# Patient Record
Sex: Male | Born: 1975 | Race: White | Hispanic: No | Marital: Single | Smoking: Current every day smoker
Health system: Southern US, Community
[De-identification: ages and names within clinical notes are randomized; demographics above are authoritative.]

## PROBLEM LIST (undated history)

## (undated) DIAGNOSIS — I10 Essential (primary) hypertension: Secondary | ICD-10-CM

## (undated) HISTORY — PX: APPENDECTOMY: SHX54

## (undated) HISTORY — DX: Essential (primary) hypertension: I10

---

## 2014-05-06 ENCOUNTER — Ambulatory Visit: Payer: Self-pay

## 2014-06-28 ENCOUNTER — Emergency Department (HOSPITAL_COMMUNITY)
Admission: EM | Admit: 2014-06-28 | Discharge: 2014-06-28 | Disposition: A | Discharge status: Home / Self Care | Payer: Self-pay

## 2014-06-28 NOTE — ED Notes (Signed)
Called no answer

## 2016-12-28 ENCOUNTER — Emergency Department (HOSPITAL_BASED_OUTPATIENT_CLINIC_OR_DEPARTMENT_OTHER): Payer: Self-pay

## 2016-12-28 ENCOUNTER — Emergency Department (HOSPITAL_BASED_OUTPATIENT_CLINIC_OR_DEPARTMENT_OTHER)
Admission: EM | Admit: 2016-12-28 | Discharge: 2016-12-28 | Disposition: A | Discharge status: Home / Self Care | Payer: Self-pay | Attending: Emergency Medicine | Admitting: Emergency Medicine

## 2016-12-28 ENCOUNTER — Encounter (HOSPITAL_BASED_OUTPATIENT_CLINIC_OR_DEPARTMENT_OTHER): Payer: Self-pay | Admitting: Emergency Medicine

## 2016-12-28 ENCOUNTER — Other Ambulatory Visit: Payer: Self-pay

## 2016-12-28 DIAGNOSIS — I10 Essential (primary) hypertension: Secondary | ICD-10-CM | POA: Insufficient documentation

## 2016-12-28 LAB — BASIC METABOLIC PANEL
ANION GAP: 8 (ref 5–15)
BUN: 16 mg/dL (ref 6–20)
CO2: 26 mmol/L (ref 22–32)
Calcium: 9.6 mg/dL (ref 8.9–10.3)
Chloride: 102 mmol/L (ref 101–111)
Creatinine, Ser: 0.89 mg/dL (ref 0.61–1.24)
GFR calc Af Amer: >60 mL/min (ref >60)
GFR calc non Af Amer: >60 mL/min (ref >60)
GLUCOSE: 108 mg/dL — AB (ref 65–99)
POTASSIUM: 3.6 mmol/L (ref 3.5–5.1)
Sodium: 136 mmol/L (ref 135–145)

## 2016-12-28 LAB — CBC
HEMATOCRIT: 42.2 % (ref 39.0–52.0)
HEMOGLOBIN: 15.2 g/dL (ref 13.0–17.0)
MCH: 31.7 pg (ref 26.0–34.0)
MCHC: 36 g/dL (ref 30.0–36.0)
MCV: 88.1 fL (ref 78.0–100.0)
Platelets: 211 10*3/uL (ref 150–400)
RBC: 4.79 MIL/uL (ref 4.22–5.81)
RDW: 12 % (ref 11.5–15.5)
WBC: 9.2 10*3/uL (ref 4.0–10.5)

## 2016-12-28 LAB — TROPONIN I: Troponin I: <0.03 ng/mL (ref <0.03)

## 2016-12-28 MED ORDER — HYDROCHLOROTHIAZIDE 12.5 MG PO TABS: 25.0000 mg | ORAL_TABLET | Freq: Every day | ORAL | 0 refills | Status: AC

## 2016-12-28 NOTE — Discharge Instructions (Signed)
Please find primary care provider for further management of your health.  Take medication prescribed for your blood pressure and have it recheck in 1 week.

## 2016-12-28 NOTE — ED Notes (Signed)
Pt c/o CP, R arm numbness, ShOB, diaphoresis. Pt reports that all theses symptoms are intermittent and have been going on greater than 1 yr.

## 2016-12-28 NOTE — ED Provider Notes (Signed)
[facility] EMERGENCY DEPARTMENT Provider Note   CSN: [id number] Arrival date & time: 12/28/16  1621     History   Chief Complaint No chief complaint on file.   HPI Jermaine Mcmillan is a 41 y.o. male.  HPI   41 year old male presenting for evaluation of high blood pressure.  Patient states he took his son to the doctor for an office visit yesterday when the nurse asked him if he is doing okay because he does not look well.  The nurse also checked his blood pressure and was noted to be 170 systolic.  She encourage patient to go to the hospital for further evaluation of his high blood pressure.  Patient mentioned for more than 6 months he has had intermittent chest discomfort.  He describes it as a sharp pain in his left chest, nonradiating, lasting for a few seconds with associated diaphoresis and shortness of breath.  Pain is nonexertional.  No active chest pain at this time.  He also endorsed having recurrent headache.  Describes headache as a tension type, affecting his forehead, lasting for the past several days.  No associated vision changes or focal numbness or weakness.  He does not follow-up with her primary care provider and have never been diagnosed with high blood pressure.  He admits that he is going through a lot of stress with his sister recently passed away from having blood clots.  He denies any cough, hemoptysis, unilateral leg swelling or calf pain.  He is a smoker and smoke close to a pack a day, and occasional drinker.  No history of diabetes.  No past medical history on file.  There are no active problems to display for this patient.   Past Surgical History:  Procedure Laterality Date  . APPENDECTOMY         Home Medications    Prior to Admission medications   Not on File    Family History No family history on file.  Social History Social History   Tobacco Use  . Smoking status: Not on file  Substance Use Topics  . Alcohol use: Not on  file  . Drug use: Not on file     Allergies   Patient has no allergy information on record.   Review of Systems Review of Systems  All other systems reviewed and are negative.    Physical Exam Updated Vital Signs BP (!) 164/115 (BP Location: Left Arm)   Pulse (!) 104   Temp 98.1 F (36.7 C) (Oral)   Resp 20   Ht 6\' 1"  (1.854 m)   Wt 136.1 kg (300 lb)   SpO2 97%   BMI 39.58 kg/m   Physical Exam  Constitutional: He appears well-developed and well-nourished. No distress.  HENT:  Head: Atraumatic.  Eyes: Conjunctivae are normal.  Neck: Neck supple.  Cardiovascular: Intact distal pulses.  Mild tachycardia without murmur rubs gallops  Pulmonary/Chest: Effort normal and breath sounds normal.  Abdominal: Soft. He exhibits no distension. There is no tenderness.  Musculoskeletal: He exhibits no edema.  Neurological: He is alert. He has normal strength. No cranial nerve deficit or sensory deficit. GCS eye subscore is 4. GCS verbal subscore is 5. GCS motor subscore is 6.  Skin: No rash noted.  Psychiatric: He has a normal mood and affect.  Nursing note and vitals reviewed.    ED Treatments / Results  Labs (all labs ordered are listed, but only abnormal results are displayed) Labs Reviewed  BASIC METABOLIC PANEL -  Abnormal; Notable for the following components:      Result Value   Glucose, Bld 108 (*)    All other components within normal limits  CBC  TROPONIN I    EKG  EKG Interpretation  Date/Time:  Friday December 28 2016 16:29:27 EST Ventricular Rate:  101 PR Interval:  152 QRS Duration: 98 QT Interval:  356 QTC Calculation: 461 R Axis:   52 Text Interpretation:  Sinus tachycardia Otherwise normal ECG No STEMI.  Confirmed by Alona BeneLong, Joshua [phone]([id number]) on 12/28/2016 4:35:16 PM       Radiology Dg Chest 2 View  Result Date: 12/28/2016 CLINICAL DATA:  Cough and congestion for 3-4 days. EXAM: CHEST  2 VIEW COMPARISON:  None. FINDINGS: Lungs are clear. Heart size  is normal. No pneumothorax or pleural effusion. No acute bony abnormality. IMPRESSION: No acute disease. Electronically Signed   By: Drusilla Kannerhomas  Dalessio M.D.   On: 12/28/2016 17:25    Procedures Procedures (including critical care time)  Medications Ordered in ED Medications - No data to display   Initial Impression / Assessment and Plan / ED Course  I have reviewed the triage vital signs and the nursing notes.  Pertinent labs & imaging results that were available during my care of the patient were reviewed by me and considered in my medical decision making (see chart for details).     BP (!) 164/115 (BP Location: Left Arm)   Pulse (!) 104   Temp 98.1 F (36.7 C) (Oral)   Resp 20   Ht 6\' 1"  (1.854 m)   Wt 136.1 kg (300 lb)   SpO2 97%   BMI 39.58 kg/m    Final Clinical Impressions(s) / ED Diagnoses   Final diagnoses:  HTN (hypertension)    ED Discharge Orders    None     4:54 PM Patient was recommended to come to the ER because his blood pressure was elevated when it was checked by a nurse yesterday.  He did not have any specific symptoms at that time.  He does voice concern because he is having recurrent chest discomfort for more than 6 months and occasional shortness of breath as well as having headache.  On exam he has no focal neuro deficit.  5:32 PM Mild tachycardia likely 2/2 anxiety.  Doubt PE.  Labs are reassuring, cxr unremarkable, EKG without concerning changes.  Will prescribe HCTZ and recommend outpt f/u with PCP.  Resources provided.  Return precaution given.     Fayrene Helperran, Nykerria Macconnell, PA-C 12/28/16 1735    Maia PlanLong, Joshua G, MD 12/28/16 [phone]

## 2016-12-28 NOTE — ED Triage Notes (Addendum)
Patient reports having BP checked yesterday and it was elevated.  Reports it was checked again today and BP was elevated at 169/113.  Reports he was told to have this checked by doctor.  C/o headache "for a couple of days", reports chest pain for  "a couple of weeks".

## 2018-04-25 IMAGING — CR DG CHEST 2V
2 series · 2 of 2 positions shown · non-contrast
Comparison: None.

CLINICAL DATA: Cough and congestion for 3-4 days.

EXAM:
CHEST  2 VIEW

[w chest pa *]
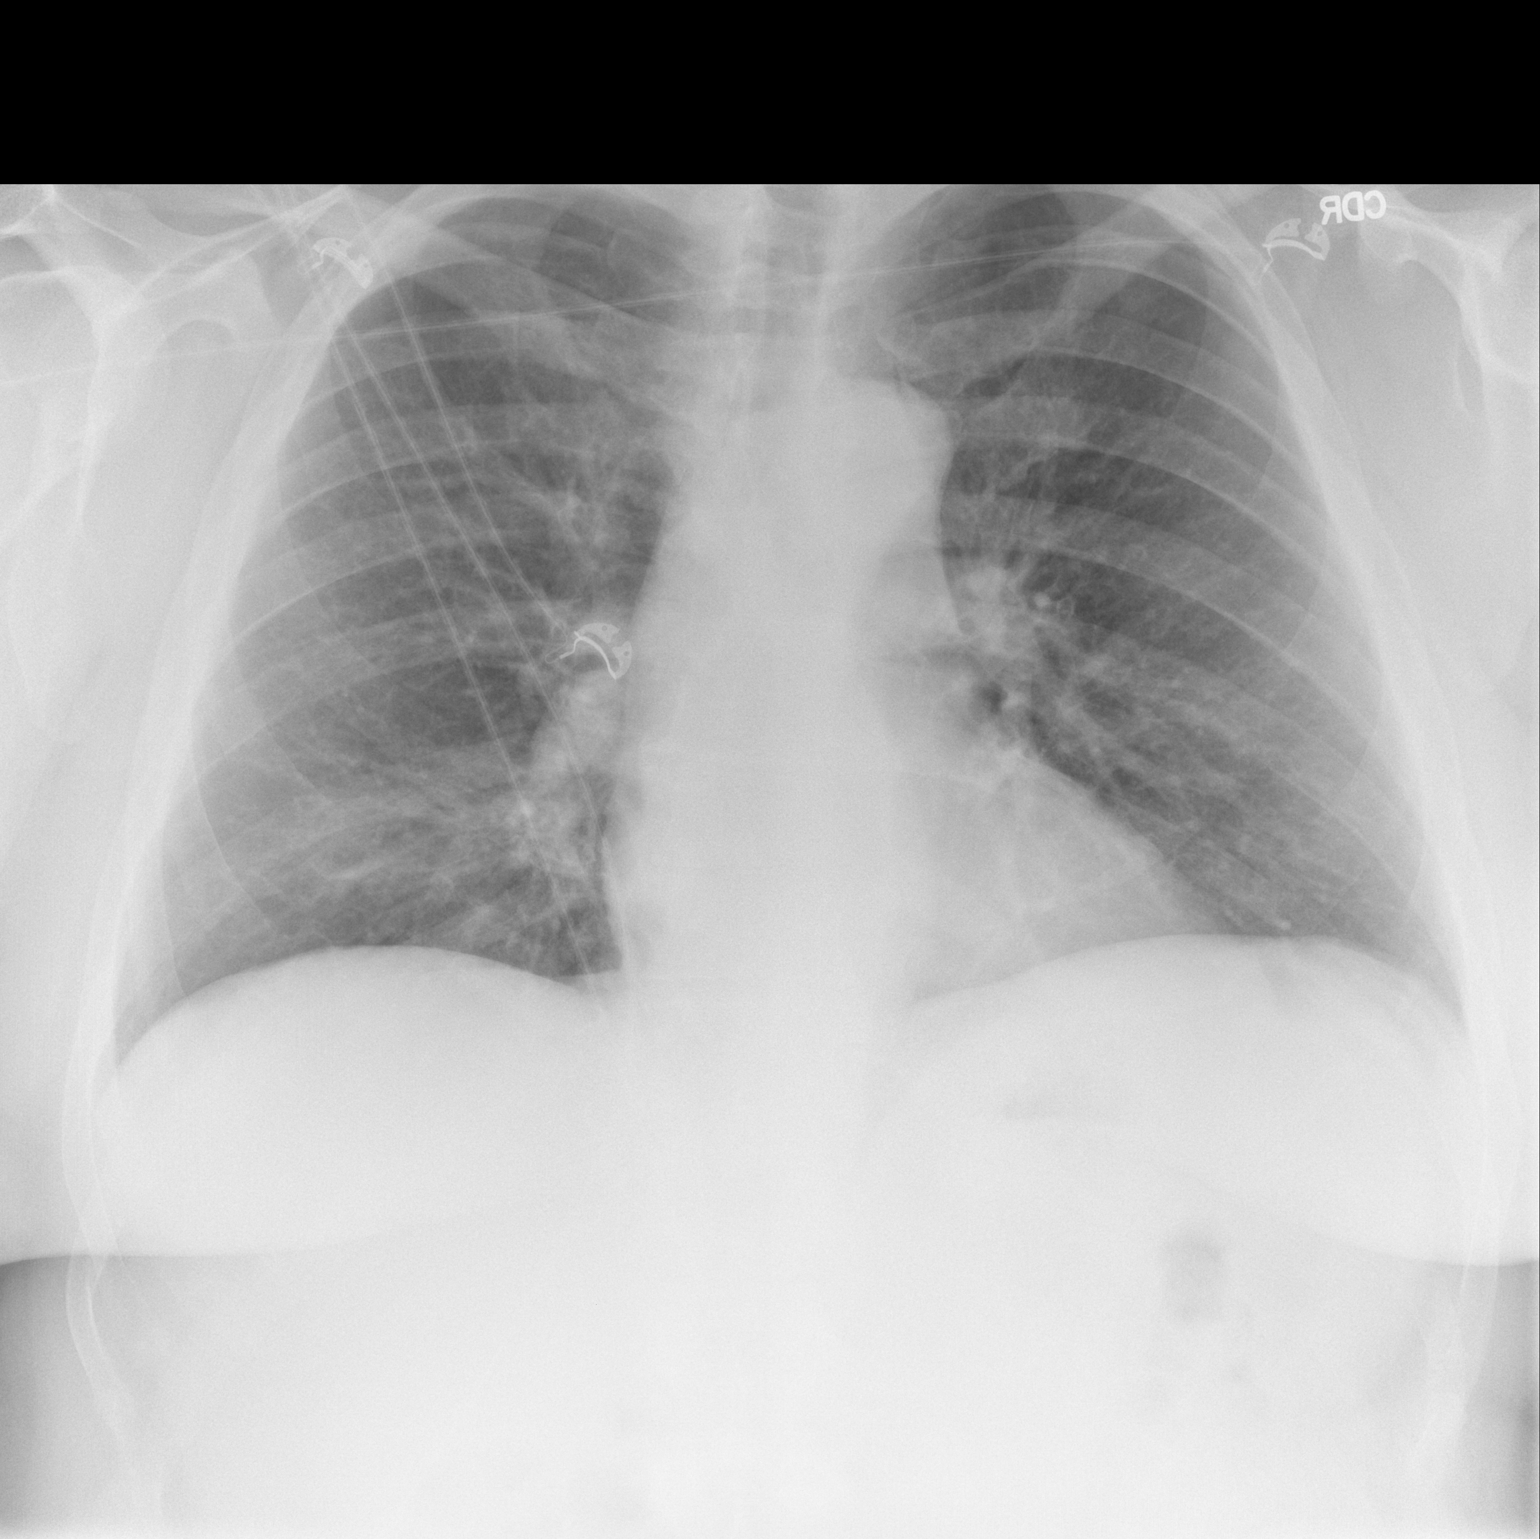

[w chest lat *]
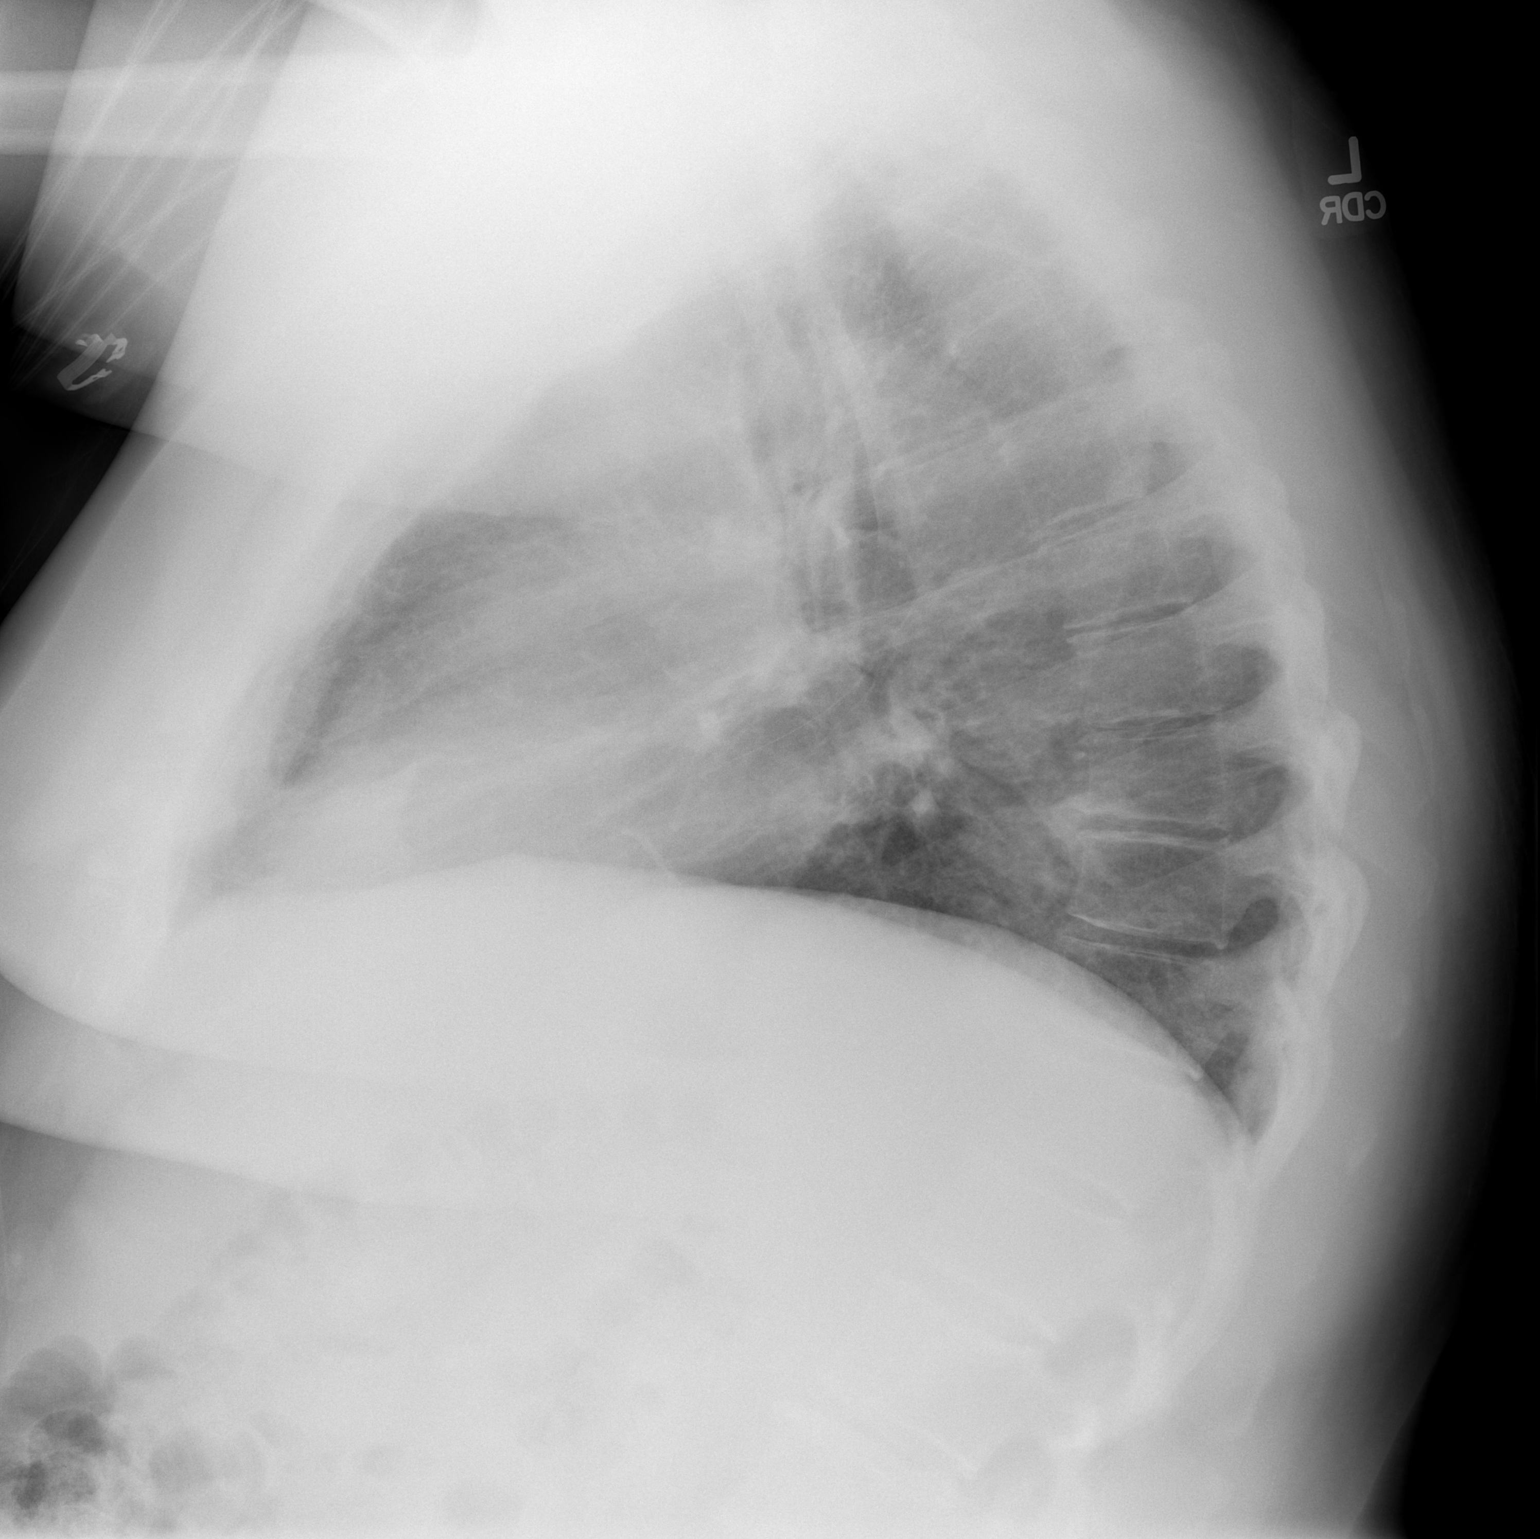

[2 of 2 positions shown; findings below may reference images not displayed]

FINDINGS: Lungs are clear. Heart size is normal. No pneumothorax or pleural
effusion. No acute bony abnormality.
IMPRESSION: No acute disease.

## 2019-12-02 ENCOUNTER — Institutional Professional Consult (permissible substitution): Payer: No Typology Code available for payment source | Admitting: Neurology

## 2019-12-03 ENCOUNTER — Encounter: Payer: Self-pay | Admitting: Neurology

## 2019-12-07 ENCOUNTER — Other Ambulatory Visit: Payer: Self-pay

## 2019-12-07 ENCOUNTER — Encounter: Payer: Self-pay | Admitting: Neurology

## 2019-12-07 ENCOUNTER — Ambulatory Visit (INDEPENDENT_AMBULATORY_CARE_PROVIDER_SITE_OTHER): Payer: No Typology Code available for payment source | Admitting: Neurology

## 2019-12-07 VITALS — BP 154/102 | HR 83 | Ht 74.0 in | Wt 353.0 lb

## 2019-12-07 DIAGNOSIS — R030 Elevated blood-pressure reading, without diagnosis of hypertension: Secondary | ICD-10-CM | POA: Diagnosis not present

## 2019-12-07 DIAGNOSIS — R0683 Snoring: Secondary | ICD-10-CM | POA: Diagnosis not present

## 2019-12-07 DIAGNOSIS — G4719 Other hypersomnia: Secondary | ICD-10-CM | POA: Diagnosis not present

## 2019-12-07 DIAGNOSIS — R351 Nocturia: Secondary | ICD-10-CM

## 2019-12-07 DIAGNOSIS — R0681 Apnea, not elsewhere classified: Secondary | ICD-10-CM

## 2019-12-07 DIAGNOSIS — G2581 Restless legs syndrome: Secondary | ICD-10-CM

## 2019-12-07 NOTE — Progress Notes (Signed)
Subjective:    Patient ID: Jermaine Mcmillan is a 44 y.o. male.  HPI     Jermaine FoleySaima Maxwel Meadowcroft, MD, PhD [facility] [address], [address] [address] [place], [place] [address]   Dear Chrissie NoaWilliam,   I saw your patient, Jermaine Mcmillan, upon your kind request in my sleep clinic today for initial consultation of his sleep disorder, in particular, concern for underlying obstructive sleep apnea.  The patient is unaccompanied today.  As you know, Mr. Jermaine Mcmillan is a 44 year old right-handed gentleman with an underlying medical history of hypertension, restless leg syndrome, prediabetes, vitamin D and severe obesity with a BMI of over 40, who reports Snoring and excessive daytime somnolence.  I reviewed your office note from 10/30/2019.  His Epworth sleepiness score is 18 out of 24, fatigue severity score is 50 out of 63. He reports being told that he has stops in his breathing. He has very occasionally woken up with a sense of gasping for air. He has no known family history of sleep apnea. His restless leg symptoms respond to Mirapex. He goes to bed around 9 and rise time is around 6. He has no trouble falling asleep. He does have significant nocturia about 3+ times per average night. He denies recurrent morning headaches. He has not fallen asleep while driving or at the wheel but has come close and at times has had all over. 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 He lives with his son who is 46 years old, the son shares the same bedroom for now until they moved to a different place. The son has a smaller bed. They have a TV in the bedroom but they don't typically have it on at night. The patient is divorced. He restarted smoking recently but is in the process of quitting. He does not smoke in the house or in the car around his son. He drinks alcohol occasionally, maybe once a week, drinks caffeine in limitation, in the  form of coffee, 1 cup in the morning, typically does not finish the cup. He has gained weight in the past year and his sleepiness and snoring have become worse over the past at least 1 year.  His Past Medical History Is Significant For: Past Medical History:  Diagnosis Date  . Hypertension     His Past Surgical History Is Significant For: Past Surgical History:  Procedure Laterality Date  . APPENDECTOMY    . APPENDECTOMY      His Family History Is Significant For: No family history on file.  His Social History Is Significant For: Social History   Socioeconomic History  . Marital status: Single    Spouse name: Not on file  . Number of children: Not on file  . Years of education: Not on file  . Highest education level: Not on file  Occupational History  . Not on file  Tobacco Use  . Smoking status: Current Every Day Smoker    Packs/day: 1.00    Types: Cigarettes  . Smokeless tobacco: Never Used  Substance and Sexual Activity  . Alcohol use: Yes  . Drug use: No  . Sexual activity: Not on file  Other Topics Concern  . Not on file  Social History Narrative  . Not on file   Social Determinants of Health   Financial Resource Strain:   . Difficulty of Paying Living Expenses: Not on file  Food Insecurity:   .  Worried About Programme researcher, broadcasting/film/video in the Last Year: Not on file  . Ran Out of Food in the Last Year: Not on file  Transportation Needs:   . Lack of Transportation (Medical): Not on file  . Lack of Transportation (Non-Medical): Not on file  Physical Activity:   . Days of Exercise per Week: Not on file  . Minutes of Exercise per Session: Not on file  Stress:   . Feeling of Stress : Not on file  Social Connections:   . Frequency of Communication with Friends and Family: Not on file  . Frequency of Social Gatherings with Friends and Family: Not on file  . Attends Religious Services: Not on file  . Active Member of Clubs or Organizations: Not on file  . Attends  Banker Meetings: Not on file  . Marital Status: Not on file    His Allergies Are:  No Known Allergies:   His Current Medications Are:  Outpatient Encounter Medications as of 12/07/2019  Medication Sig  . amLODipine-valsartan (EXFORGE) 5-320 MG tablet Take 1 tablet by mouth daily.  . ergocalciferol (VITAMIN D2) 1.25 MG (50000 UT) capsule Take by mouth.  . fenofibrate 160 MG tablet Take by mouth.  Marland Kitchen FLUoxetine (PROZAC) 20 MG capsule Take by mouth.  . hydrochlorothiazide (HYDRODIURIL) 12.5 MG tablet Take 2 tablets (25 mg total) by mouth daily.  Marland Kitchen labetalol (NORMODYNE) 100 MG tablet Take by mouth.  . pramipexole (MIRAPEX) 0.5 MG tablet TAKE 1 TABLET BY MOUTH IN THE MORNING AND AT BEDTIME.  . Semaglutide, 1 MG/DOSE, 2 MG/1.5ML SOPN Inject into the skin.  Marland Kitchen spironolactone (ALDACTONE) 25 MG tablet Take 2 tablets by mouth daily.   No facility-administered encounter medications on file as of 12/07/2019.  :  Review of Systems:  Out of a complete 14 point review of systems, all are reviewed and negative with the exception of these symptoms as listed below: Review of Systems  Neurological:       Pt presents today to discuss his sleep. Pt has never had a sleep study but does endorse snoring.  Epworth Sleepiness Scale 0= would never doze 1= slight chance of dozing 2= moderate chance of dozing 3= high chance of dozing  Sitting and reading: 2 Watching TV: 3 Sitting inactive in a public place (ex. Theater or meeting): 2 As a passenger in a car for an hour without a break: 1 Lying down to rest in the afternoon: 3 Sitting and talking to someone: 2 Sitting quietly after lunch (no alcohol): 3 In a car, while stopped in traffic: 2 Total: 18     Objective:  Neurological Exam  Physical Exam Physical Examination:   Vitals:   12/07/19 0758  BP: (!) 154/102  Pulse: 83    General Examination: The patient is a very pleasant 44 y.o. male in no acute distress. He appears  well-developed and well-nourished and well groomed.   HEENT: Normocephalic, atraumatic, pupils are equal, round and reactive to light, extraocular tracking is good without limitation to gaze excursion or nystagmus noted. Hearing is grossly intact. Face is symmetric with normal facial animation. Speech is clear with no dysarthria noted. There is no hypophonia. There is no lip, neck/head, jaw or voice tremor. Neck is supple with full range of passive and active motion. There are no carotid bruits on auscultation. Oropharynx exam reveals: mild mouth dryness, adequate dental hygiene and moderate airway crowding, due to larger tongue, tonsillar size is smaller, uvula larger, small airway entry  and thicker soft palate noted. Mallampati class III, neck circumference of 20 three-quarter inches. He has a mild overbite. Tongue protrudes centrally and palate elevates symmetrically.  Chest: Clear to auscultation without wheezing, rhonchi or crackles noted.  Heart: S1+S2+0, regular and normal without murmurs, rubs or gallops noted.   Abdomen: Soft, non-tender and non-distended with normal bowel sounds appreciated on auscultation.  Extremities: There is no pitting edema in the distal lower extremities bilaterally.   Skin: Warm and dry without trophic changes noted.   Musculoskeletal: exam reveals no obvious joint deformities, tenderness or joint swelling or erythema.   Neurologically:  Mental status: The patient is awake, alert and oriented in all 4 spheres. His immediate and remote memory, attention, language skills and fund of knowledge are appropriate. There is no evidence of aphasia, agnosia, apraxia or anomia. Speech is clear with normal prosody and enunciation. Thought process is linear. Mood is normal and affect is normal.  Cranial nerves II - XII are as described above under HEENT exam.  Motor exam: Normal bulk, strength and tone is noted. There is no tremor, Romberg is negative. Fine motor skills and  coordination: grossly intact.  Cerebellar testing: No dysmetria or intention tremor. There is no truncal or gait ataxia.  Sensory exam: intact to light touch in the upper and lower extremities.  Gait, station and balance: He stands easily. No veering to one side is noted. No leaning to one side is noted. Posture is age-appropriate and stance is narrow based. Gait shows normal stride length and normal pace. No problems turning are noted. Tandem walk is unremarkable.                Assessment and Plan:  In summary, Jerrion Tabbert is a very pleasant 44 y.o.-year old male  with an underlying medical history of hypertension, restless leg syndrome, prediabetes, vitamin D and severe obesity with a BMI of over 40, whose history and physical exam are concerning for obstructive sleep apnea (OSA). I had a long chat with the patient about my findings and the diagnosis of OSA, its prognosis and treatment options. We talked about medical treatments, surgical interventions and non-pharmacological approaches. I explained in particular the risks and ramifications of untreated moderate to severe OSA, especially with respect to developing cardiovascular disease down the Road, including congestive heart failure, difficult to treat hypertension, cardiac arrhythmias, or stroke. Even type 2 diabetes has, in part, been linked to untreated OSA. Symptoms of untreated OSA include daytime sleepiness, memory problems, mood irritability and mood disorder such as depression and anxiety, lack of energy, as well as recurrent headaches, especially morning headaches. We talked about smoking cessation and trying to maintain a healthy lifestyle in general, as well as the importance of weight control. We also talked about the importance of good sleep hygiene. I recommended the following at this time: sleep study.  I explained the sleep test procedure to the patient and also outlined possible surgical and non-surgical treatment options of OSA,  including the use of a custom-made dental device (which would require a referral to a specialist dentist or oral surgeon), upper airway surgical options, such as traditional UPPP or a novel less invasive surgical option in the form of Inspire hypoglossal nerve stimulation (which would involve a referral to an ENT surgeon). I also explained the CPAP treatment option to the patient, who indicated that he would be willing to try CPAP if the need arises. I explained the importance of being compliant with PAP treatment, not only  for insurance purposes but primarily to improve His symptoms, and for the patient's long term health benefit, including to reduce His cardiovascular risks. I answered all his questions today and the patient was in agreement. I plan to see him back after the sleep study is completed and encouraged him to call with any interim questions, concerns, problems or updates.   Thank you very much for allowing me to participate in the care of this nice patient. If I can be of any further assistance to you please do not hesitate to call me at [phone].  Sincerely,   Jermaine Foley, MD, PhD

## 2019-12-07 NOTE — Patient Instructions (Signed)
